# Patient Record
Sex: Female | Born: 2000 | Race: Black or African American | Hispanic: No | Marital: Single | Smoking: Never smoker
Health system: Southern US, Community
[De-identification: ages and names within clinical notes are randomized; demographics above are authoritative.]

## PROBLEM LIST (undated history)

## (undated) HISTORY — PX: NO PAST SURGERIES: SHX2092

---
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
## 2019-03-28 ENCOUNTER — Other Ambulatory Visit: Payer: Self-pay

## 2019-03-28 ENCOUNTER — Encounter (HOSPITAL_COMMUNITY): Payer: Self-pay | Admitting: Emergency Medicine

## 2019-03-28 ENCOUNTER — Ambulatory Visit (HOSPITAL_COMMUNITY)
Admission: EM | Admit: 2019-03-28 | Discharge: 2019-03-28 | Disposition: A | Discharge status: Home / Self Care | Payer: Medicaid Other | Attending: Family Medicine | Admitting: Family Medicine

## 2019-03-28 DIAGNOSIS — R000 Tachycardia, unspecified: Secondary | ICD-10-CM

## 2019-03-28 DIAGNOSIS — J039 Acute tonsillitis, unspecified: Secondary | ICD-10-CM

## 2019-03-28 LAB — POCT RAPID STREP A: Streptococcus, Group A Screen (Direct): NEGATIVE

## 2019-03-28 LAB — POCT INFECTIOUS MONO SCREEN: Mono Screen: NEGATIVE

## 2019-03-28 MED ORDER — ACETAMINOPHEN 325 MG PO TABS
ORAL_TABLET | ORAL | Status: AC
  Filled 2019-03-28: qty 2

## 2019-03-28 MED ORDER — ACETAMINOPHEN 325 MG PO TABS
650.0000 mg | ORAL_TABLET | Freq: Once | ORAL | Status: AC
  Administered 2019-03-28: 650 mg via ORAL

## 2019-03-28 MED ORDER — PREDNISONE 50 MG PO TABS: 50.0000 mg | ORAL_TABLET | Freq: Every day | ORAL | 0 refills | Status: AC

## 2019-03-28 MED ORDER — IBUPROFEN 600 MG PO TABS: 600.0000 mg | ORAL_TABLET | Freq: Four times a day (QID) | ORAL | 0 refills | Status: DC | PRN

## 2019-03-28 MED ORDER — AMOXICILLIN-POT CLAVULANATE 875-125 MG PO TABS: 1.0000 | ORAL_TABLET | Freq: Two times a day (BID) | ORAL | 0 refills | Status: AC

## 2019-03-28 NOTE — ED Provider Notes (Signed)
[facility]    CSN: [id number] Arrival date & time: 03/28/19  1544     History   Chief Complaint Chief Complaint  Patient presents with  . Headache  . Adenopathy    HPI Bonnie Nicholson is a 18 y.o. female no significant past medical history presenting today for evaluation of sore throat and swollen lymph nodes.  Patient states that over the past 3 days she has developed swollen lymph nodes and pain especially on the left side of her neck.  Has discomfort with swallowing.  She noted fevers beginning yesterday.  Denies associated cough or congestion.  She has had a headache and associated chills.  Denies known exposure to strep or mono.  HPI  History reviewed. No pertinent past medical history.  There are no active problems to display for this patient.   History reviewed. No pertinent surgical history.  OB History   No obstetric history on file.      Home Medications    Prior to Admission medications   Medication Sig Start Date End Date Taking? Authorizing Provider  amoxicillin-clavulanate (AUGMENTIN) 875-125 MG tablet Take 1 tablet by mouth every 12 (twelve) hours for 10 days. 03/28/19 04/07/19  Xena Propst C, PA-C  ibuprofen (ADVIL) 600 MG tablet Take 1 tablet (600 mg total) by mouth every 6 (six) hours as needed. 03/28/19   Unika Nazareno C, PA-C  naproxen (NAPROSYN) 375 MG tablet Take 1 tablet (375 mg total) by mouth 2 (two) times daily as needed for moderate pain. 03/31/18   Wurst, [place], PA-C  predniSONE (DELTASONE) 50 MG tablet Take 1 tablet (50 mg total) by mouth daily for 5 days. 03/28/19 04/02/19  Torrie Lafavor, Junius CreamerHallie C, PA-C    Family History History reviewed. No pertinent family history.  Social History Social History   Tobacco Use  . Smoking status: Never Smoker  . Smokeless tobacco: Never Used  Substance Use Topics  . Alcohol use: Never    Frequency: Never  . Drug use: Never     Allergies   Patient has no known allergies.   Review of  Systems Review of Systems  Constitutional: Positive for chills and fever. Negative for activity change, appetite change and fatigue.  HENT: Positive for sore throat. Negative for congestion, ear pain, rhinorrhea, sinus pressure and trouble swallowing.   Eyes: Negative for discharge and redness.  Respiratory: Negative for cough, chest tightness and shortness of breath.   Cardiovascular: Negative for chest pain.  Gastrointestinal: Negative for abdominal pain, diarrhea, nausea and vomiting.  Musculoskeletal: Negative for myalgias.  Skin: Negative for rash.  Neurological: Positive for headaches. Negative for dizziness and light-headedness.     Physical Exam Triage Vital Signs ED Triage Vitals  Enc Vitals Group     BP 03/28/19 1654 120/69     Pulse Rate 03/28/19 1654 (!) 103     Resp 03/28/19 1654 12     Temp 03/28/19 1654 (!) 102.7 F (39.3 C)     Temp Source 03/28/19 1654 Oral     SpO2 03/28/19 1654 99 %     Weight --      Height --      Head Circumference --      Peak Flow --      Pain Score 03/28/19 1656 10     Pain Loc --      Pain Edu? --      Excl. in GC? --    No data found.  Updated Vital Signs BP 120/69 (BP  Location: Right Arm)   Pulse (!) 103   Temp (!) 102.7 F (39.3 C) (Oral)   Resp 12   LMP 03/22/2019 (Approximate)   SpO2 99%   Visual Acuity Right Eye Distance:   Left Eye Distance:   Bilateral Distance:    Right Eye Near:   Left Eye Near:    Bilateral Near:     Physical Exam Vitals signs and nursing note reviewed.  Constitutional:      General: She is not in acute distress.    Appearance: She is well-developed.  HENT:     Head: Normocephalic and atraumatic.  Eyes:     Conjunctiva/sclera: Conjunctivae normal.     Comments: Bilateral tonsils enlarged, left more swollen compared to right, bilateral with erythema and exudate diffusely bilaterally, posterior pharynx patent, uvula midline, no uvular swelling, no significant soft palate swelling   Neck:     Musculoskeletal: Neck supple.     Comments: Full active range of motion of neck, no obvious neck swelling or erythema, palpable left tonsillar lymphadenopathy as well as anterior left cervical chain Cardiovascular:     Rate and Rhythm: Regular rhythm. Tachycardia present.     Heart sounds: No murmur.  Pulmonary:     Effort: Pulmonary effort is normal. No respiratory distress.     Breath sounds: Normal breath sounds.     Comments: Breathing comfortably at rest, CTABL, no wheezing, rales or other adventitious sounds auscultated Abdominal:     Palpations: Abdomen is soft.     Tenderness: There is no abdominal tenderness.  Skin:    General: Skin is warm and dry.  Neurological:     Mental Status: She is alert.      UC Treatments / Results  Labs (all labs ordered are listed, but only abnormal results are displayed) Labs Reviewed  CULTURE, GROUP A STREP [facility])  POCT INFECTIOUS MONO SCREEN  POCT RAPID STREP A  POCT INFECTIOUS MONO SCREEN    EKG   Radiology No results found.  Procedures Procedures (including critical care time)  Medications Ordered in UC Medications  acetaminophen (TYLENOL) tablet 650 mg (650 mg Oral Given 03/28/19 1702)  acetaminophen (TYLENOL) 325 MG tablet (has no administration in time range)    Initial Impression / Assessment and Plan / UC Course  I have reviewed the triage vital signs and the nursing notes.  Pertinent labs & imaging results that were available during my care of the patient were reviewed by me and considered in my medical decision making (see chart for details).     Patient with fever, tachycardia, bilateral tonsillitis, left more prominent than right, airway patent and patient is stable at this time. Strep and Mono negative, will proceed with treating with antibiotics given apperance. Augmentin BID x 7 days, Prednisone x 5 days to help with swelling.   Follow up in ED if symptoms not resolving with above or worsening.  Discussed strict return precautions. Patient verbalized understanding and is agreeable with plan.  Final Clinical Impressions(s) / UC Diagnoses   Final diagnoses:  Acute tonsillitis, unspecified etiology     Discharge Instructions     Augmentin twice daily for 10 days Prednisone daily for 5 days with food Use anti-inflammatories for pain/swelling/fever. You may take up to 800 mg Ibuprofen every 8 hours with food. You may supplement Ibuprofen with Tylenol [phone] mg every 8 hours.   Follow up in the emergency room if not responding to antibiotics, developing increased swelling, difficulty swallowing, difficulty breathing, neck stiffness  or swelling    ED Prescriptions    Medication Sig Dispense Auth. Provider   amoxicillin-clavulanate (AUGMENTIN) 875-125 MG tablet Take 1 tablet by mouth every 12 (twelve) hours for 10 days. 20 tablet Karista Aispuro C, PA-C   predniSONE (DELTASONE) 50 MG tablet Take 1 tablet (50 mg total) by mouth daily for 5 days. 5 tablet Seith Aikey C, PA-C   ibuprofen (ADVIL) 600 MG tablet Take 1 tablet (600 mg total) by mouth every 6 (six) hours as needed. 30 tablet Acie Custis, Register C, PA-C     Controlled Substance Prescriptions [place] Controlled Substance Registry consulted? Not Applicable   Janith Lima, [place] 03/29/19 1027

## 2019-03-28 NOTE — Discharge Instructions (Signed)
Augmentin twice daily for 10 days Prednisone daily for 5 days with food Use anti-inflammatories for pain/swelling/fever. You may take up to 800 mg Ibuprofen every 8 hours with food. You may supplement Ibuprofen with Tylenol [phone] mg every 8 hours.   Follow up in the emergency room if not responding to antibiotics, developing increased swelling, difficulty swallowing, difficulty breathing, neck stiffness or swelling

## 2019-03-28 NOTE — ED Triage Notes (Signed)
Pt reports swelling to her left neck/lymph nodes that started three days ago.  She states it hurts on that side when she swallows.  She has also had a headache with chills.

## 2019-04-01 LAB — CULTURE, GROUP A STREP (THRC)

## 2019-07-20 ENCOUNTER — Encounter (HOSPITAL_COMMUNITY): Payer: Self-pay | Admitting: Emergency Medicine

## 2019-07-20 ENCOUNTER — Other Ambulatory Visit: Payer: Self-pay

## 2019-07-20 ENCOUNTER — Ambulatory Visit (HOSPITAL_COMMUNITY)
Admission: EM | Admit: 2019-07-20 | Discharge: 2019-07-20 | Disposition: A | Discharge status: Home / Self Care | Payer: Medicaid Other | Attending: Family Medicine | Admitting: Family Medicine

## 2019-07-20 DIAGNOSIS — Z20828 Contact with and (suspected) exposure to other viral communicable diseases: Secondary | ICD-10-CM | POA: Insufficient documentation

## 2019-07-20 DIAGNOSIS — R519 Headache, unspecified: Secondary | ICD-10-CM | POA: Insufficient documentation

## 2019-07-20 NOTE — ED Triage Notes (Signed)
Had an "extreme " headache for 2 days and neck soreness for 2 days, but both are feeling better now.   Vomited x 1 this morning

## 2019-07-20 NOTE — Discharge Instructions (Signed)
If your Covid-19 test is positive, you will get a phone call from [facility] regarding your results. If your Covid-19 test is negative, you will NOT get a phone call from [facility] with your results. You may view your results on MyChart. If you do not have a MyChart account, sign up instructions are in your discharge papers. ° °

## 2019-07-22 LAB — NOVEL CORONAVIRUS, NAA (HOSP ORDER, SEND-OUT TO REF LAB; TAT 18-24 HRS): SARS-CoV-2, NAA: NOT DETECTED

## 2019-07-22 NOTE — ED Provider Notes (Signed)
[facility]   [id number] 07/20/19 Arrival Time: 1429  ASSESSMENT & PLAN:  1. Bad headache     Symptoms/HA has resolved.  Normal neurological exam. Afebrile without nuchal rigidity. Discussed. Current presentation and symptoms are consistent with prior migraines and are not consistent with SAH, ICH, meningitis, or temporal arteritis. Without fever, focal neuro logical deficits, nuchal rigidity, or change in vision. No indication for neurodiagnostic workup at this time. Discussed.  COVID-19 testing sent. To self-quarantine until results are availabe.  Recommend: Follow-up Information    [facility]   Specialty: Urgent Care Contact information: [address] [place] [address] [phone]         To ED if abrupt worsening; she voices understanding.  Reviewed expectations re: course of current medical issues. Questions answered. Outlined signs and symptoms indicating need for more acute intervention. Patient verbalized understanding. After Visit Summary given.   SUBJECTIVE: History from: patient Patient is able to give a clear and coherent history.  Bonnie Nicholson is a 18 y.o. female who presents with complaint of a "a bad headache". Onset gradual, two d ago and has now resolved. Location: frontal extending occipitally. History of headaches: occasional with similar symptoms. Precipitating factors include: none which have been determined. Associated symptoms: Preceding aura: no. Nausea/vomiting: one emesis this morning; none since; no current nausea; no current HA. Without abdominal pain. Patient's last menstrual period was 07/08/2019. Vision changes: no. Increased sensitivity to light and to noises: no. Fever: no. Sinus pressure/congestion: no. Extremity weakness: no. Home treatment has included resting and sleeping with little improvement. Current headache did not imited normal daily activities. Denies  depression, dizziness, loss of balance, muscle weakness, numbness of extremities, speech difficulties, vision problems and vomiting in the early morning. No head injury reported. Ambulatory without difficulty. No recent travel.  ROS: As per HPI. All other systems negative.    OBJECTIVE:  Vitals:   07/20/19 1454  BP: 108/72  Pulse: 74  Resp: 16  Temp: 98.4 F (36.9 C)  TempSrc: Oral  SpO2: 99%    General appearance: alert; NAD HENT: normocephalic; atraumatic Eyes: PERRLA; EOMI; conjunctivae normal Neck: supple with FROM Lungs: clear to auscultation bilaterally; unlabored respirations Heart: regular rate and rhythm Extremities: no edema; symmetrical with no gross deformities Skin: warm and dry Neurologic: alert; speech is fluent and clear without dysarthria or aphasia; CN 2-12 grossly intact; no facial droop; normal gait; normal symmetric reflexes; normal extremity strength and sensation throughout; bilateral upper and lower extremity sensation is grossly intact with 5/5 symmetric strength; normal grip strength Psychological: alert and cooperative; normal mood and affect  Labs:  Labs Reviewed  NOVEL CORONAVIRUS, NAA (HOSP ORDER, SEND-OUT TO REF LAB; TAT 18-24 HRS)     No Known Allergies  PMH: Tonsillitis.  Social History   Socioeconomic History  . Marital status: Single    Spouse name: Not on file  . Number of children: Not on file  . Years of education: Not on file  . Highest education level: Not on file  Occupational History  . Not on file  Social Needs  . Financial resource strain: Not on file  . Food insecurity    Worry: Not on file    Inability: Not on file  . Transportation needs    Medical: Not on file    Non-medical: Not on file  Tobacco Use  . Smoking status: Never Smoker  . Smokeless tobacco: Never Used  Substance and Sexual Activity  .  Alcohol use: Never    Frequency: Never  . Drug use: Never  . Sexual activity: Not on file  Lifestyle  .  Physical activity    Days per week: Not on file    Minutes per session: Not on file  . Stress: Not on file  Relationships  . Social Herbalist on phone: Not on file    Gets together: Not on file    Attends religious service: Not on file    Active member of club or organization: Not on file    Attends meetings of clubs or organizations: Not on file    Relationship status: Not on file  . Intimate partner violence    Fear of current or ex partner: Not on file    Emotionally abused: Not on file    Physically abused: Not on file    Forced sexual activity: Not on file  Other Topics Concern  . Not on file  Social History Narrative  . Not on file   Family History  Problem Relation Age of Onset  . Diabetes Mother    History reviewed. No pertinent surgical history.   Vanessa Kick, MD 07/22/19 [phone]

## 2020-01-11 ENCOUNTER — Ambulatory Visit (HOSPITAL_COMMUNITY)
Admission: EM | Admit: 2020-01-11 | Discharge: 2020-01-11 | Disposition: A | Discharge status: Home / Self Care | Payer: Medicaid Other

## 2020-01-11 ENCOUNTER — Other Ambulatory Visit: Payer: Self-pay

## 2020-01-11 DIAGNOSIS — H9202 Otalgia, left ear: Secondary | ICD-10-CM | POA: Diagnosis not present

## 2020-01-11 NOTE — ED Provider Notes (Signed)
[facility]    CSN: [id number] Arrival date & time: 01/11/20  1352      History   Chief Complaint Chief Complaint  Patient presents with  . Otalgia    Left ear    HPI Bonnie Nicholson is a 19 y.o. female.   Patient reports that she got her ears pierced x2 months ago.  She reports that the left third piercing has been feeling like it was infected.  She reports that she took the piercing out 3 days ago.  Reports that the area is looking and feeling better than it was.  Reports that she had an inner earache on the left ear as well that has since resolved.  Also reports that when her left ear was hurting, she had a headache that has also since resolved.  Currently denies headache, cough, shortness of breath, sore throat, fever, nausea, vomiting, diarrhea, rash, other symptoms.  ROS per HPI  The history is provided by the patient.    No past medical history on file.  There are no problems to display for this patient.   No past surgical history on file.  OB History   No obstetric history on file.      Home Medications    Prior to Admission medications   Medication Sig Start Date End Date Taking? Authorizing Provider  ibuprofen (ADVIL) 600 MG tablet Take 1 tablet (600 mg total) by mouth every 6 (six) hours as needed. 03/28/19   Wieters, Hallie C, PA-C  naproxen (NAPROSYN) 375 MG tablet Take 1 tablet (375 mg total) by mouth 2 (two) times daily as needed for moderate pain. 03/31/18   Rennis Harding, PA-C    Family History Family History  Problem Relation Age of Onset  . Diabetes Mother     Social History Social History   Tobacco Use  . Smoking status: Never Smoker  . Smokeless tobacco: Never Used  Substance Use Topics  . Alcohol use: Never  . Drug use: Never     Allergies   Patient has no known allergies.   Review of Systems Review of Systems   Physical Exam Triage Vital Signs ED Triage Vitals  Enc Vitals Group     BP 01/11/20 1506 102/69   Pulse Rate 01/11/20 1506 87     Resp 01/11/20 1506 16     Temp 01/11/20 1506 98.2 F (36.8 C)     Temp Source 01/11/20 1506 Oral     SpO2 01/11/20 1506 98 %     Weight --      Height --      Head Circumference --      Peak Flow --      Pain Score 01/11/20 1507 6     Pain Loc --      Pain Edu? --      Excl. in GC? --    No data found.  Updated Vital Signs BP 102/69 (BP Location: Right Arm)   Pulse 87   Temp 98.2 F (36.8 C) (Oral)   Resp 16   LMP 01/11/2020   SpO2 98%   Visual Acuity Right Eye Distance:   Left Eye Distance:   Bilateral Distance:    Right Eye Near:   Left Eye Near:    Bilateral Near:     Physical Exam Vitals and nursing note reviewed.  Constitutional:      General: She is not in acute distress.    Appearance: Normal appearance. She is well-developed and normal weight.  She is not ill-appearing.  HENT:     Head: Normocephalic and atraumatic.     Right Ear: Tympanic membrane normal.     Left Ear: Tympanic membrane normal.     Nose: Nose normal.  Eyes:     Extraocular Movements: Extraocular movements intact.     Conjunctiva/sclera: Conjunctivae normal.     Pupils: Pupils are equal, round, and reactive to light.  Cardiovascular:     Rate and Rhythm: Normal rate and regular rhythm.     Heart sounds: Normal heart sounds. No murmur.  Pulmonary:     Effort: Pulmonary effort is normal. No respiratory distress.     Breath sounds: Normal breath sounds. No stridor. No wheezing, rhonchi or rales.  Chest:     Chest wall: No tenderness.  Abdominal:     Palpations: Abdomen is soft.     Tenderness: There is no abdominal tenderness.  Musculoskeletal:        General: Normal range of motion.     Cervical back: Neck supple.  Skin:    General: Skin is warm and dry.     Capillary Refill: Capillary refill takes less than 2 seconds.  Neurological:     General: No focal deficit present.     Mental Status: She is alert and oriented to person, place, and time.    Psychiatric:        Mood and Affect: Mood normal.        Behavior: Behavior normal.        Thought Content: Thought content normal.      UC Treatments / Results  Labs (all labs ordered are listed, but only abnormal results are displayed) Labs Reviewed - No data to display  EKG   Radiology No results found.  Procedures Procedures (including critical care time)  Medications Ordered in UC Medications - No data to display  Initial Impression / Assessment and Plan / UC Course  I have reviewed the triage vital signs and the nursing notes.  Pertinent labs & imaging results that were available during my care of the patient were reviewed by me and considered in my medical decision making (see chart for details).     Left otalgia: Left ear pain for the last day.  Bilateral TMs pearly gray.  No concern for infection.  Patient instructed that if she is having pain she can take ibuprofen or Tylenol as needed.  Patient is to follow-up with primary care as needed, since all symptoms had resolved by this visit today.  Patient verbalized understanding and agrees with treatment plan. Final Clinical Impressions(s) / UC Diagnoses   Final diagnoses:  Otalgia of left ear     Discharge Instructions     I would like to use peroxide to clean the area of the piercing that seems infected.  You may also use half peroxide, half water to drop into your ears if your inner ear is bothering you.  You may take Tylenol or ibuprofen as needed for your headaches.  If you are not feeling better over the next few days, follow-up here with primary care as needed.    ED Prescriptions    None     PDMP not reviewed this encounter.   Faustino Congress, NP 01/11/20 2133

## 2020-01-11 NOTE — Discharge Instructions (Addendum)
I would like to use peroxide to clean the area of the piercing that seems infected.  You may also use half peroxide, half water to drop into your ears if your inner ear is bothering you.  You may take Tylenol or ibuprofen as needed for your headaches.  If you are not feeling better over the next few days, follow-up here with primary care as needed.

## 2020-01-11 NOTE — ED Triage Notes (Signed)
Pt present left ear pain, she recently got her left ear pierced and every since her left ear has been in pain.

## 2020-03-17 ENCOUNTER — Encounter: Payer: Medicaid Other | Admitting: Women's Health

## 2020-03-17 ENCOUNTER — Encounter: Payer: Self-pay | Admitting: Women's Health

## 2020-06-19 ENCOUNTER — Other Ambulatory Visit: Payer: Self-pay

## 2020-06-19 ENCOUNTER — Ambulatory Visit (HOSPITAL_COMMUNITY)
Admission: EM | Admit: 2020-06-19 | Discharge: 2020-06-19 | Disposition: A | Discharge status: Home / Self Care | Payer: Medicaid Other | Attending: Urgent Care | Admitting: Urgent Care

## 2020-06-19 ENCOUNTER — Encounter (HOSPITAL_COMMUNITY): Payer: Self-pay

## 2020-06-19 DIAGNOSIS — B86 Scabies: Secondary | ICD-10-CM

## 2020-06-19 MED ORDER — PERMETHRIN 5 % EX CREA: TOPICAL_CREAM | CUTANEOUS | 0 refills | Status: DC

## 2020-06-19 NOTE — Discharge Instructions (Signed)
Thoroughly massage cream (30 g for average adult) from head to soles of feet; leave on for 8 to 14 hours before removing (shower or bath). 

## 2020-06-19 NOTE — ED Triage Notes (Signed)
Pt presents today with rash all over body. States that it does itch x4 days. Recently got back from vacation and since then has noticed rash.

## 2020-06-19 NOTE — ED Provider Notes (Signed)
  [facility]   MRN: [id number] DOB: May 15, 2001  Subjective:   Diksha Robeck is a 19 y.o. female presenting for 4-day history of acute onset pruritic rash over her body.  Patient was on vacation and thinks this is where she got it.  Denies fever, pain, drainage of pus or bleeding.  No current facility-administered medications for this encounter.  Current Outpatient Medications:  .  ibuprofen (ADVIL) 600 MG tablet, Take 1 tablet (600 mg total) by mouth every 6 (six) hours as needed., Disp: 30 tablet, Rfl: 0 .  naproxen (NAPROSYN) 375 MG tablet, Take 1 tablet (375 mg total) by mouth 2 (two) times daily as needed for moderate pain., Disp: 20 tablet, Rfl: 0   No Known Allergies  History reviewed. No pertinent past medical history.   History reviewed. No pertinent surgical history.  Family History  Problem Relation Age of Onset  . Diabetes Mother     Social History   Tobacco Use  . Smoking status: Never Smoker  . Smokeless tobacco: Never Used  Vaping Use  . Vaping Use: Never used  Substance Use Topics  . Alcohol use: Never  . Drug use: Never    ROS   Objective:   Vitals: BP 95/61 (BP Location: Left Arm)   Temp 98.4 F (36.9 C) (Oral)   Physical Exam Constitutional:      General: She is not in acute distress.    Appearance: Normal appearance. She is well-developed. She is not ill-appearing, toxic-appearing or diaphoretic.  HENT:     Head: Normocephalic and atraumatic.     Nose: Nose normal.     Mouth/Throat:     Mouth: Mucous membranes are moist.     Pharynx: Oropharynx is clear.  Eyes:     General: No scleral icterus.       Right eye: No discharge.        Left eye: No discharge.     Extraocular Movements: Extraocular movements intact.     Conjunctiva/sclera: Conjunctivae normal.     Pupils: Pupils are equal, round, and reactive to light.  Cardiovascular:     Rate and Rhythm: Normal rate.  Pulmonary:     Effort: Pulmonary effort is  normal.  Skin:    General: Skin is warm and dry.     Findings: Rash (Multiple burrows in linear distribution scattered over limbs and back) present.  Neurological:     General: No focal deficit present.     Mental Status: She is alert and oriented to person, place, and time.  Psychiatric:        Mood and Affect: Mood normal.        Behavior: Behavior normal.        Thought Content: Thought content normal.        Judgment: Judgment normal.     Assessment and Plan :   PDMP not reviewed this encounter.  1. Scabies     Start permethrin cream. Counseled patient on potential for adverse effects with medications prescribed/recommended today, ER and return-to-clinic precautions discussed, patient verbalized understanding.    Wallis Bamberg, [place] 06/19/20 1939

## 2020-08-21 ENCOUNTER — Emergency Department (HOSPITAL_COMMUNITY)
Admission: EM | Admit: 2020-08-21 | Discharge: 2020-08-21 | Disposition: A | Discharge status: Home / Self Care | Payer: Medicaid Other | Attending: Emergency Medicine | Admitting: Emergency Medicine

## 2020-08-21 ENCOUNTER — Encounter (HOSPITAL_COMMUNITY): Payer: Self-pay | Admitting: Emergency Medicine

## 2020-08-21 ENCOUNTER — Other Ambulatory Visit: Payer: Self-pay

## 2020-08-21 DIAGNOSIS — M79602 Pain in left arm: Secondary | ICD-10-CM | POA: Diagnosis present

## 2020-08-21 DIAGNOSIS — R202 Paresthesia of skin: Secondary | ICD-10-CM | POA: Insufficient documentation

## 2020-08-21 MED ORDER — PREDNISONE 20 MG PO TABS: 40.0000 mg | ORAL_TABLET | Freq: Every day | ORAL | 0 refills | Status: AC

## 2020-08-21 NOTE — Discharge Instructions (Addendum)
I have given you a prescription for steroids today.  Some common side effects include feelings of extra energy, feeling warm, increased appetite, and stomach upset.  If you are diabetic your sugars may run higher than usual.   As we discussed I suspect you have a compressed nerve causing your abnormal sensations in your left arm.  Steroids can help reduce this swelling.  I have given you a low dose to see if it will help.  If it does not get better in the next few days please get seen by orthopedics.  I have also given you information for [place], if you do not have a primary care doctor and/or do not have insurance you can call them to set up an appointment.  If you have Medicaid please look on your card to see if your primary care doctor is listed, if there is not one listed in a call Medicaid to see where you can be seen.     If your symptoms do not improve in the next seven days then please start the steroid.  Please allow at least seven days for your symptoms to improve before starting the steroids.    If you develop fevers, weakness, chest pain, shortness of breath, severe neck pain, or have any new or concerning symptoms including similar symptoms in the other arm please seek additional medical care and evaluation.

## 2020-08-21 NOTE — ED Provider Notes (Signed)
[facility] EMERGENCY DEPARTMENT Provider Note   CSN: [id number] Arrival date & time: 08/21/20  1305     History Chief Complaint  Patient presents with  . Arm Pain    left    Bonnie Nicholson is a 19 y.o. female who presents today for evaluation of abnormal sensation in her left arm and wrist for 1 day.  The symptoms initially started with the left long and ring finger palmar part of the hand, and have progressed up into her arm and into her neck.  The abnormal sensation is intermittent.  She denies any recent trauma.  No chiropractic adjustments.  She denies true numbness and that she is still able to feel everything however does report that she has had intermediate increased sensation.  She denies any pain in her neck or arm.  No chest pain, cough, shortness of breath.  No history of connective tissue disorder.  She denies any weakness, no changes to bowel or bladder function.  No vision changes.  She denies any fevers and has otherwise been well.  No personal history of cancer or IV drug abuse.  She has never had similar symptoms before.    She states that she took ibuprofen prior to arrival and her symptoms have currently improved and she is not currently having the abnormal sensations.   HPI     History reviewed. No pertinent past medical history.  There are no problems to display for this patient.   History reviewed. No pertinent surgical history.   OB History   No obstetric history on file.     Family History  Problem Relation Age of Onset  . Diabetes Mother     Social History   Tobacco Use  . Smoking status: Never Smoker  . Smokeless tobacco: Never Used  Vaping Use  . Vaping Use: Never used  Substance Use Topics  . Alcohol use: Never  . Drug use: Never    Home Medications Prior to Admission medications   Medication Sig Start Date End Date Taking? Authorizing Provider  ibuprofen (ADVIL) 600 MG tablet Take 1 tablet (600 mg total) by mouth  every 6 (six) hours as needed. 03/28/19   Wieters, Hallie C, PA-C  naproxen (NAPROSYN) 375 MG tablet Take 1 tablet (375 mg total) by mouth 2 (two) times daily as needed for moderate pain. 03/31/18   Wurst, [place], PA-C  permethrin (ELIMITE) 5 % cream Thoroughly massage cream (30 g for average adult) from head to soles of feet; leave on for 8 to 14 hours before removing (shower or bath). 06/19/20   Wallis Bamberg, PA-C  predniSONE (DELTASONE) 20 MG tablet Take 2 tablets (40 mg total) by mouth daily for 4 days. 08/21/20 08/25/20  Cristina Gong, PA-C    Allergies    Patient has no known allergies.  Review of Systems   Review of Systems  Constitutional: Negative for chills and fever.  HENT: Negative for congestion.   Eyes: Negative for visual disturbance.  Respiratory: Negative for cough, chest tightness and shortness of breath.   Cardiovascular: Negative for chest pain.  Gastrointestinal: Negative for abdominal pain.  Musculoskeletal: Negative for back pain, neck pain and neck stiffness.  Neurological: Negative for dizziness, syncope, speech difficulty, weakness, numbness (Change in sensation in left arm) and headaches.  All other systems reviewed and are negative.   Physical Exam Updated Vital Signs BP 116/69 (BP Location: Right Arm)   Pulse 75   Temp 98.3 F (36.8 C) (Oral)  Resp 20   SpO2 98%   Physical Exam Vitals and nursing note reviewed.  Constitutional:      General: She is not in acute distress. HENT:     Head: Normocephalic and atraumatic.  Cardiovascular:     Rate and Rhythm: Normal rate and regular rhythm.     Pulses: Normal pulses.     Comments: 2+ left radial pulse, capillary refill to fingers on left hand is under 2 seconds, left hand is warm and well bruised. Pulmonary:     Effort: Pulmonary effort is normal. No respiratory distress.     Breath sounds: Normal breath sounds. No wheezing or rhonchi.  Chest:     Chest wall: No tenderness.  Musculoskeletal:      Cervical back: Normal range of motion and neck supple. No rigidity or tenderness.     Comments: Compartments in left arm are soft and easily compressible.  5/5 strength left arm.  Full range of motion of left arm and neck.  No midline tenderness palpation, step-offs, or deformities in C/upper T-spine.  No tenderness to palpation in left arm. No TTP in left trapezius muscle   Skin:    General: Skin is warm and dry.     Comments: No rash on visualized skin, no ecchymosis, contusion, abnormal erythema or induration visualized.  Neurological:     Mental Status: She is alert.     Comments: Awake and alert, answers all questions appropriately.  Speech is not slurred.   5/5 strength left upper extremity.  Currently sensation intact to light touch to left upper extremity.  Psychiatric:        Mood and Affect: Mood normal.        Behavior: Behavior normal.     ED Results / Procedures / Treatments   Labs (all labs ordered are listed, but only abnormal results are displayed) Labs Reviewed - No data to display  EKG None  Radiology No results found.  Procedures Procedures (including critical care time)  Medications Ordered in ED Medications - No data to display  ED Course  I have reviewed the triage vital signs and the nursing notes.  Pertinent labs & imaging results that were available during my care of the patient were reviewed by me and considered in my medical decision making (see chart for details).    MDM Rules/Calculators/A&P                         Patient presents today for evaluation of intermittent paresthesias to her left upper limb that started yesterday.  On exam at this time she is not currently having these abnormal sensations stating that she took ibuprofen prior to arrival and they resolved.  She states that when she has taken ibuprofen prior they temporarily resolve and then come back.  No chest pain, cough, or shortness of breath.  On my exam she is neurovascularly  intact.    I suspect that she may be having cervical radiculopathy from bulging disk or compressed nerve.  She is not having a pain in her symptoms have currently resolved which he attributes to ibuprofen.  She declines muscle relaxers.  She did not have any trauma, no history of IV drug use, cancer.  No weakness.    She is concerned that once the ibuprofen wears off her symptoms will return as they have before.  No evidence of infection.  Patient does not have a primary care doctor.  She requested a prescription  for "something" to help.  She is given a prescription for prednisone with instructions only to take it if her symptoms do not spontaneously improve in the next 7 days with conservative care.    She is also given orthopedics follow up.    Return precautions were discussed with patient who states their understanding.  At the time of discharge patient denied any unaddressed complaints or concerns.  Patient is agreeable for discharge home.  Note: Portions of this report may have been transcribed using voice recognition software. Every effort was made to ensure accuracy; however, inadvertent computerized transcription errors may be present   Final Clinical Impression(s) / ED Diagnoses Final diagnoses:  Paresthesia of left upper limb    Rx / DC Orders ED Discharge Orders         Ordered    predniSONE (DELTASONE) 20 MG tablet  Daily        08/21/20 1516           Cristina Gong, Cordelia Poche 08/21/20 1532    Mancel Bale, MD 08/21/20 1624

## 2020-08-21 NOTE — ED Triage Notes (Signed)
C/o left arm and wrist pain for 1 day.

## 2021-02-15 ENCOUNTER — Encounter: Payer: Self-pay | Admitting: General Practice

## 2021-02-15 ENCOUNTER — Other Ambulatory Visit (HOSPITAL_COMMUNITY)
Admission: RE | Admit: 2021-02-15 | Discharge: 2021-02-15 | Disposition: A | Discharge status: Home / Self Care | Payer: Medicaid Other | Source: Ambulatory Visit | Attending: Obstetrics & Gynecology | Admitting: Obstetrics & Gynecology

## 2021-02-15 ENCOUNTER — Encounter: Payer: Self-pay | Admitting: Obstetrics & Gynecology

## 2021-02-15 ENCOUNTER — Ambulatory Visit (INDEPENDENT_AMBULATORY_CARE_PROVIDER_SITE_OTHER): Payer: Medicaid Other | Admitting: Obstetrics & Gynecology

## 2021-02-15 ENCOUNTER — Other Ambulatory Visit: Payer: Self-pay

## 2021-02-15 VITALS — BP 107/55 | HR 64 | Ht 66.0 in | Wt 122.0 lb

## 2021-02-15 DIAGNOSIS — N898 Other specified noninflammatory disorders of vagina: Secondary | ICD-10-CM

## 2021-02-15 DIAGNOSIS — Z113 Encounter for screening for infections with a predominantly sexual mode of transmission: Secondary | ICD-10-CM | POA: Diagnosis present

## 2021-02-15 DIAGNOSIS — F1220 Cannabis dependence, uncomplicated: Secondary | ICD-10-CM

## 2021-02-15 DIAGNOSIS — Z01419 Encounter for gynecological examination (general) (routine) without abnormal findings: Secondary | ICD-10-CM

## 2021-02-15 NOTE — Progress Notes (Signed)
Pt presents today as new patient to establish care. LMP: 01/28/21. Pt is not currently on [facility] and does not wish to go on anything at this time.

## 2021-02-15 NOTE — Progress Notes (Signed)
Subjective:     Bonnie Nicholson is a 20 y.o. female here for a routine exam. G0 Current complaints: sexually active. Female partners. Prev in a hetero relationship. Concerned about exposure to casual HSV (drinking from same cup).  Pt denies other GYN complaints. Smokes THC on a reg bases. 'Can stop at any time'.  recently stooped during Ramadan. 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 Plans to go to [place] this summer in June. Ultimately wants to be an attorney.   +FH of DM and HTN in mother and aunt.    Gynecologic History Patient's last menstrual period was 01/28/2021 (approximate). Contraception: none Last Pap: n.a.  Last mammogram: n/a Obstetric History OB History  Gravida Para Term Preterm AB Living  0 0 0 0 0 0  SAB IAB Ectopic Multiple Live Births  0 0 0 0 0   Common ambulatory SmartLinks:[id number]}  Review of Systems Pertinent items are noted in HPI.    Objective:  BP (!) 107/55 (BP Location: Right Arm, Patient Position: Sitting, Cuff Size: Normal)   Pulse 64   Ht 5\' 6"  (1.676 m)   Wt 122 lb (55.3 kg)   LMP 01/28/2021 (Approximate)   BMI 19.69 kg/m      General Appearance:    Alert, cooperative, no distress, appears stated age  Head:    Normocephalic, without obvious abnormality, atraumatic  Eyes:    conjunctiva/corneas clear, EOM's intact, both eyes  Ears:    Normal external ear canals, both ears  Nose:   Nares normal, septum midline, mucosa normal, no drainage    or sinus tenderness  Throat:   Lips, mucosa, and tongue normal; teeth and gums normal  Neck:   Supple, symmetrical, trachea midline, no adenopathy;    thyroid:  no enlargement/tenderness/nodules  Back:     Symmetric, no curvature, ROM normal, no CVA tenderness  Lungs:     respirations unlabored  Chest Wall:    No tenderness or deformity   Heart:    Regular rate and rhythm  Breast Exam:    No tenderness, masses, or nipple abnormality  Abdomen:     Soft, non-tender, bowel sounds active all four quadrants,    no  masses, no organomegaly  Genitalia:    Normal female without lesion, discharge or tenderness   White curd like discharge  Extremities:   Extremities normal, atraumatic, no cyanosis or edema  Pulses:   2+ and symmetric all extremities  Skin:   Skin color, texture, turgor normal, no rashes or lesions    Assessment:    Healthy female exam.   STI screen- pt educated on testing an sx of HSV Abnormal discharge- need to r/o yeast  THC use- counseled on the risk and recommended cessation.     Plan:  Bonnie Nicholson was seen today for gynecologic exam.  Diagnoses and all orders for this visit:  Well female exam with routine gynecological exam -     Cervicovaginal ancillary only( [place])  Routine screening for STI (sexually transmitted infection) -     Cervicovaginal ancillary only( [place])  Tetrahydrocannabinol (THC) use disorder, moderate, dependence (HCC)  White vaginal discharge -     Cervicovaginal ancillary only( [place])  f/u in 1 year or sooner prn   [place] Ebron L. Harraway-Smith, M.D., 03/30/2021

## 2021-02-16 LAB — CERVICOVAGINAL ANCILLARY ONLY
Bacterial Vaginitis (gardnerella): POSITIVE — AB
Candida Glabrata: NEGATIVE
Candida Vaginitis: POSITIVE — AB
Chlamydia: NEGATIVE
Comment: NEGATIVE
Comment: NEGATIVE
Comment: NEGATIVE
Comment: NEGATIVE
Comment: NEGATIVE
Comment: NORMAL
Neisseria Gonorrhea: NEGATIVE
Trichomonas: NEGATIVE

## 2021-02-18 ENCOUNTER — Other Ambulatory Visit: Payer: Self-pay | Admitting: Obstetrics & Gynecology

## 2021-02-18 ENCOUNTER — Other Ambulatory Visit: Payer: Self-pay

## 2021-02-18 MED ORDER — FLUCONAZOLE 150 MG PO TABS: 150.0000 mg | ORAL_TABLET | Freq: Once | ORAL | 3 refills | Status: AC

## 2021-02-18 MED ORDER — METRONIDAZOLE 500 MG PO TABS: 500.0000 mg | ORAL_TABLET | Freq: Two times a day (BID) | ORAL | 0 refills | Status: AC

## 2021-07-05 ENCOUNTER — Other Ambulatory Visit: Payer: Self-pay

## 2021-07-05 ENCOUNTER — Emergency Department (HOSPITAL_BASED_OUTPATIENT_CLINIC_OR_DEPARTMENT_OTHER)
Admission: EM | Admit: 2021-07-05 | Discharge: 2021-07-05 | Disposition: A | Discharge status: Home / Self Care | Payer: Medicaid Other | Attending: Emergency Medicine | Admitting: Emergency Medicine

## 2021-07-05 ENCOUNTER — Emergency Department (HOSPITAL_BASED_OUTPATIENT_CLINIC_OR_DEPARTMENT_OTHER): Payer: Medicaid Other | Admitting: Radiology

## 2021-07-05 ENCOUNTER — Encounter (HOSPITAL_BASED_OUTPATIENT_CLINIC_OR_DEPARTMENT_OTHER): Payer: Self-pay | Admitting: *Deleted

## 2021-07-05 DIAGNOSIS — Z20822 Contact with and (suspected) exposure to covid-19: Secondary | ICD-10-CM | POA: Insufficient documentation

## 2021-07-05 DIAGNOSIS — J040 Acute laryngitis: Secondary | ICD-10-CM | POA: Insufficient documentation

## 2021-07-05 DIAGNOSIS — R059 Cough, unspecified: Secondary | ICD-10-CM | POA: Diagnosis present

## 2021-07-05 DIAGNOSIS — Z28310 Unvaccinated for covid-19: Secondary | ICD-10-CM | POA: Insufficient documentation

## 2021-07-05 DIAGNOSIS — R051 Acute cough: Secondary | ICD-10-CM

## 2021-07-05 LAB — RESP PANEL BY RT-PCR (FLU A&B, COVID) ARPGX2
Influenza A by PCR: NEGATIVE
Influenza B by PCR: NEGATIVE
SARS Coronavirus 2 by RT PCR: NEGATIVE

## 2021-07-05 LAB — GROUP A STREP BY PCR: Group A Strep by PCR: NOT DETECTED

## 2021-07-05 NOTE — ED Triage Notes (Deleted)
Pt has had shob for about a year with worsening in last few days. Pt has A Fib, slight diminished breath sounds in bases. 

## 2021-07-05 NOTE — Discharge Instructions (Addendum)
You were seen here today for a cough and dry throat.  You had a negative COVID, flu, and strep test.  This is most likely a viral illness.  Please stay well-hydrated with fluids, mainly water.  You may use over-the-counter decongestant such as Robitussin-DM.  Be careful with taking Tylenol/ibuprofen with over-the-counter cough and cold medicine as these also include Tylenol.  Additionally, you may try warm liquids such as teas as you have been doing for relief of symptoms.  Is important to keep your throat well lubricated with these liquids and things such as cough drops.  As requested, I have included a referral to a primary care practice in this discharge paperwork. if you have any concern, new or worsening symptoms, please return to the ED.

## 2021-07-05 NOTE — ED Notes (Signed)
Pt from home with cough and sore throat x 1 day. Pt states her grandfather had similar symptoms and was hospitalized last week. Pt alert & oriented, nad noted.

## 2021-07-05 NOTE — ED Notes (Signed)
Pt discharged home after verbalizing understanding of discharge instructions; nad noted. 
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
## 2021-07-05 NOTE — ED Triage Notes (Signed)
Sore dry throat and cough started yesterday. Cough with production of yellow at intervals. No other symptoms.

## 2021-08-14 ENCOUNTER — Encounter (HOSPITAL_BASED_OUTPATIENT_CLINIC_OR_DEPARTMENT_OTHER): Payer: Self-pay | Admitting: *Deleted

## 2021-08-14 ENCOUNTER — Other Ambulatory Visit: Payer: Self-pay

## 2021-08-14 ENCOUNTER — Emergency Department (HOSPITAL_BASED_OUTPATIENT_CLINIC_OR_DEPARTMENT_OTHER)
Admission: EM | Admit: 2021-08-14 | Discharge: 2021-08-14 | Disposition: A | Discharge status: Home / Self Care | Payer: Medicaid Other | Attending: Emergency Medicine | Admitting: Emergency Medicine

## 2021-08-14 DIAGNOSIS — M542 Cervicalgia: Secondary | ICD-10-CM | POA: Diagnosis present

## 2021-08-14 DIAGNOSIS — M549 Dorsalgia, unspecified: Secondary | ICD-10-CM | POA: Diagnosis not present
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
## 2021-08-14 NOTE — Discharge Instructions (Signed)
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
## 2021-08-20 ENCOUNTER — Encounter (HOSPITAL_BASED_OUTPATIENT_CLINIC_OR_DEPARTMENT_OTHER): Payer: Self-pay | Admitting: Emergency Medicine

## 2021-08-20 ENCOUNTER — Other Ambulatory Visit: Payer: Self-pay

## 2021-08-20 DIAGNOSIS — Z5321 Procedure and treatment not carried out due to patient leaving prior to being seen by health care provider: Secondary | ICD-10-CM | POA: Insufficient documentation

## 2021-08-20 DIAGNOSIS — M542 Cervicalgia: Secondary | ICD-10-CM | POA: Diagnosis present
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
## 2021-08-20 NOTE — ED Notes (Addendum)
Pt called for triage no answer x1. 

## 2021-08-21 ENCOUNTER — Emergency Department (HOSPITAL_BASED_OUTPATIENT_CLINIC_OR_DEPARTMENT_OTHER)
Admission: EM | Admit: 2021-08-21 | Discharge: 2021-08-21 | Disposition: A | Discharge status: Home / Self Care | Payer: Medicaid Other | Attending: Emergency Medicine | Admitting: Emergency Medicine

## 2021-12-24 ENCOUNTER — Encounter: Payer: Self-pay | Admitting: General Practice

## 2022-05-27 IMAGING — DX DG CHEST 2V
2 series · 2 of 2 positions shown · non-contrast
Comparison: No pertinent prior exams available for comparison.

CLINICAL DATA: Cough. Additional history provided: Patient reports
sore and dry throat, cough which began yesterday, cough productive
of yellow sputum.

EXAM:
CHEST - 2 VIEW

[chest pa]
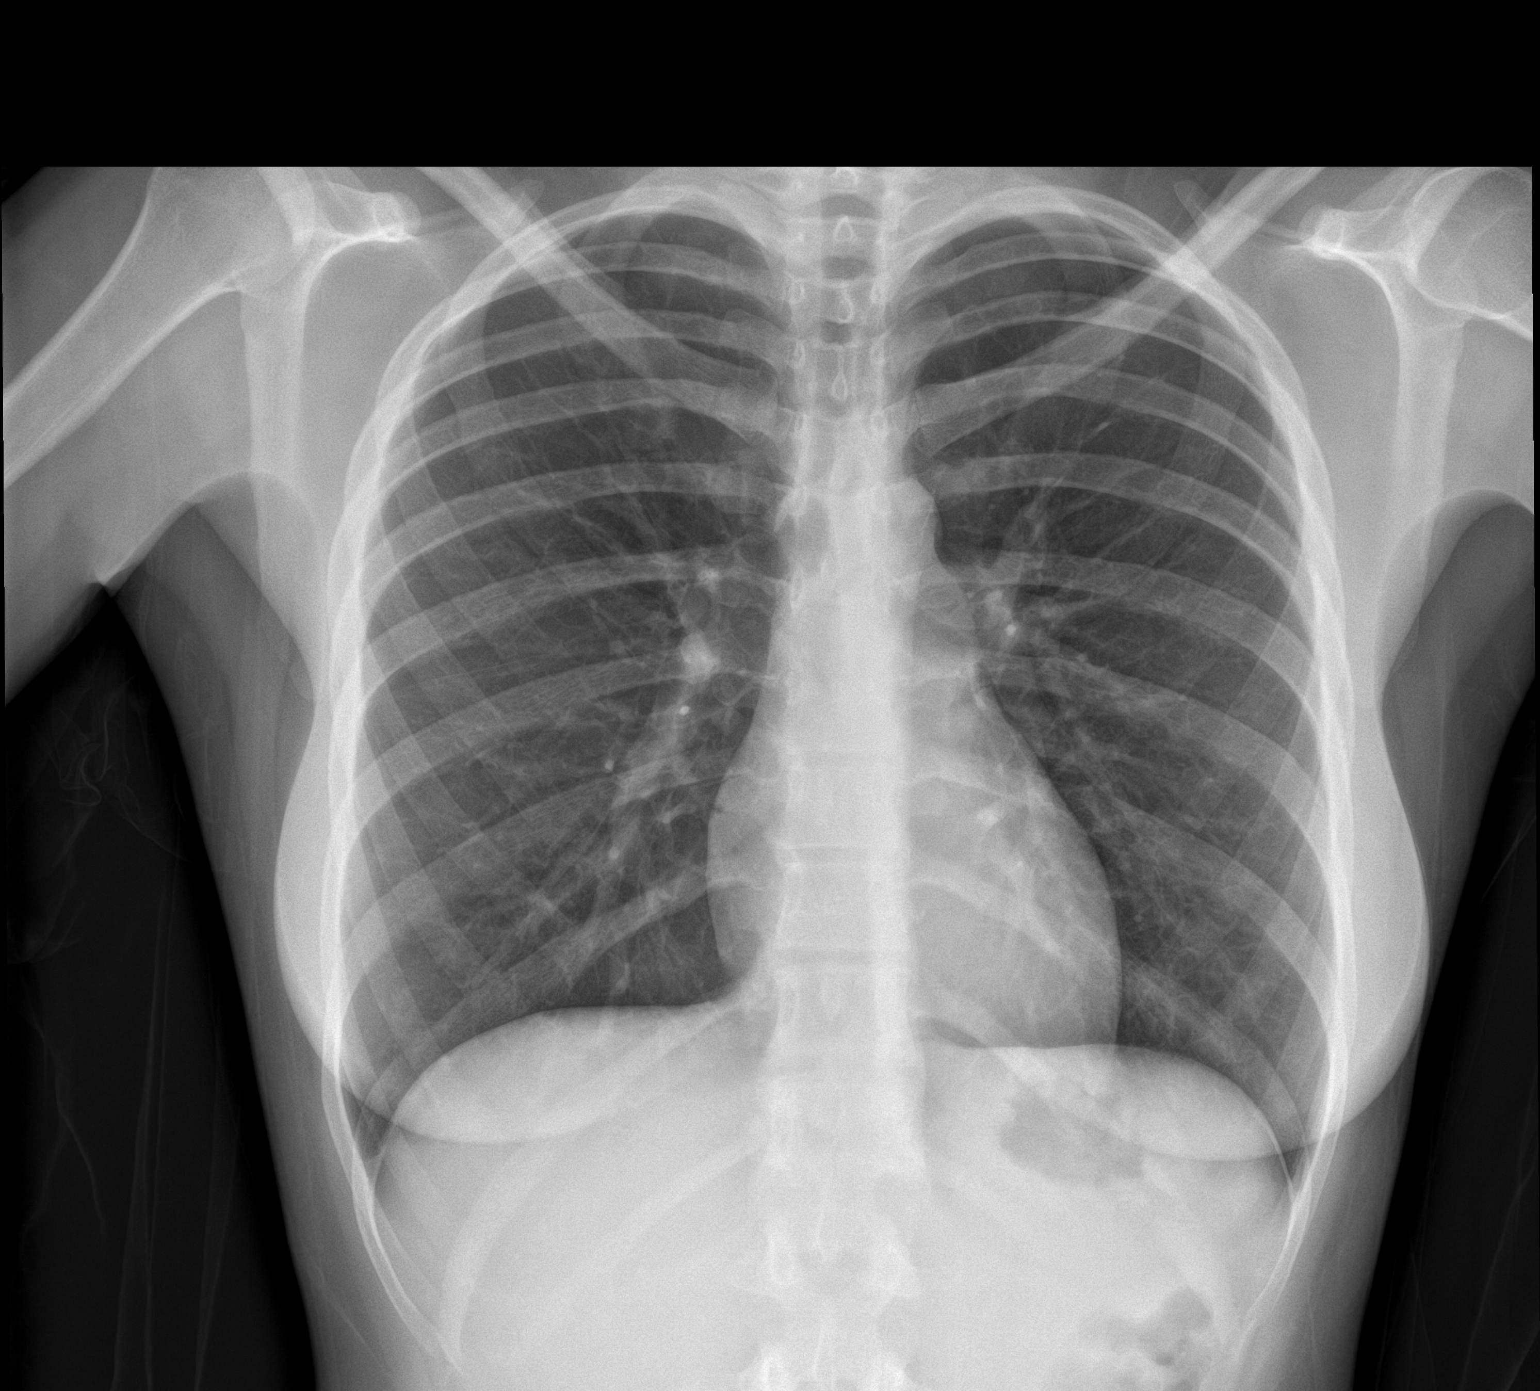

[chest lat]
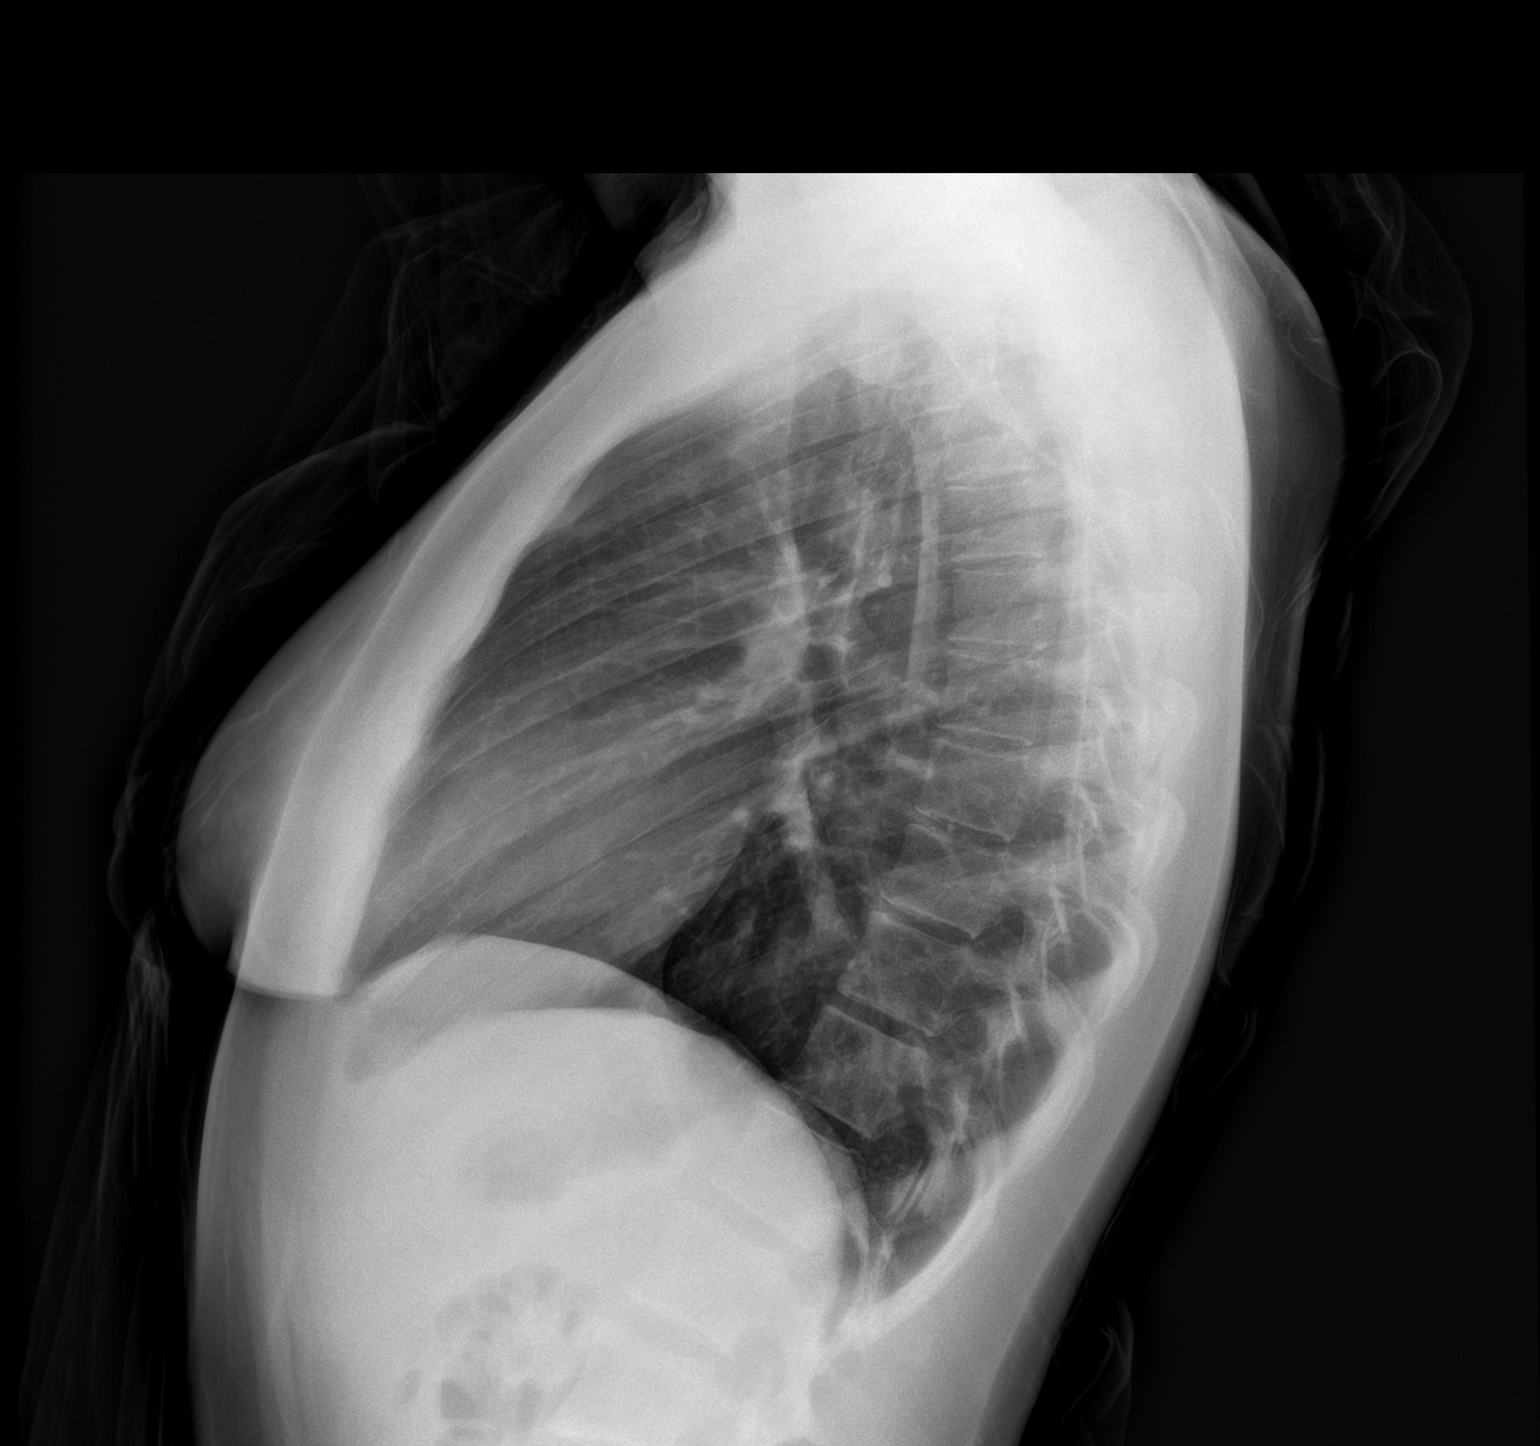

[2 of 2 positions shown; findings below may reference images not displayed]

FINDINGS: Heart size within normal limits. No appreciable airspace
consolidation. No evidence of pleural effusion or pneumothorax. No
acute bony abnormality identified.
IMPRESSION: No evidence of active cardiopulmonary disease.

## 2024-04-12 ENCOUNTER — Encounter: Payer: Self-pay | Admitting: Advanced Practice Midwife
